# Patient Record
Sex: Female | Born: 1960 | Race: White | Hispanic: No | Marital: Married | Smoking: Former smoker
Health system: Southern US, Community
[De-identification: ages and names within clinical notes are randomized; demographics above are authoritative.]

## PROBLEM LIST (undated history)

## (undated) DIAGNOSIS — F410 Panic disorder [episodic paroxysmal anxiety] without agoraphobia: Secondary | ICD-10-CM

## (undated) DIAGNOSIS — E785 Hyperlipidemia, unspecified: Secondary | ICD-10-CM

## (undated) HISTORY — PX: BREAST ENHANCEMENT SURGERY: SHX7

## (undated) HISTORY — DX: Panic disorder (episodic paroxysmal anxiety): F41.0

## (undated) HISTORY — DX: Hyperlipidemia, unspecified: E78.5

## (undated) HISTORY — PX: MOUTH SURGERY: SHX715

---

## 1999-03-10 ENCOUNTER — Other Ambulatory Visit: Admission: RE | Admit: 1999-03-10 | Discharge: 1999-03-10 | Payer: Self-pay | Admitting: Obstetrics & Gynecology

## 2000-05-30 ENCOUNTER — Other Ambulatory Visit: Admission: RE | Admit: 2000-05-30 | Discharge: 2000-05-30 | Payer: Self-pay | Admitting: Obstetrics & Gynecology

## 2001-07-25 ENCOUNTER — Other Ambulatory Visit: Admission: RE | Admit: 2001-07-25 | Discharge: 2001-07-25 | Payer: Self-pay | Admitting: Neurology

## 2002-09-24 ENCOUNTER — Other Ambulatory Visit: Admission: RE | Admit: 2002-09-24 | Discharge: 2002-09-24 | Payer: Self-pay | Admitting: Obstetrics & Gynecology

## 2003-09-30 ENCOUNTER — Other Ambulatory Visit: Admission: RE | Admit: 2003-09-30 | Discharge: 2003-09-30 | Payer: Self-pay | Admitting: Obstetrics & Gynecology

## 2005-01-22 ENCOUNTER — Other Ambulatory Visit: Admission: RE | Admit: 2005-01-22 | Discharge: 2005-01-22 | Payer: Self-pay | Admitting: Obstetrics & Gynecology

## 2011-05-17 ENCOUNTER — Emergency Department (HOSPITAL_BASED_OUTPATIENT_CLINIC_OR_DEPARTMENT_OTHER)
Admission: EM | Admit: 2011-05-17 | Discharge: 2011-05-17 | Disposition: A | Discharge status: Home / Self Care | Payer: 59 | Attending: Emergency Medicine | Admitting: Emergency Medicine

## 2011-05-17 ENCOUNTER — Emergency Department (INDEPENDENT_AMBULATORY_CARE_PROVIDER_SITE_OTHER): Payer: 59

## 2011-05-17 DIAGNOSIS — R079 Chest pain, unspecified: Secondary | ICD-10-CM | POA: Insufficient documentation

## 2011-05-17 DIAGNOSIS — R0602 Shortness of breath: Secondary | ICD-10-CM | POA: Insufficient documentation

## 2011-05-17 LAB — DIFFERENTIAL
Eosinophils Relative: 6 % — ABNORMAL HIGH (ref 0–5)
Lymphocytes Relative: 39 % (ref 12–46)
Lymphs Abs: 2.9 10*3/uL (ref 0.7–4.0)
Monocytes Absolute: 0.5 10*3/uL (ref 0.1–1.0)
Monocytes Relative: 7 % (ref 3–12)
Neutro Abs: 3.6 10*3/uL (ref 1.7–7.7)

## 2011-05-17 LAB — BASIC METABOLIC PANEL
CO2: 27 mEq/L (ref 19–32)
Calcium: 10.1 mg/dL (ref 8.4–10.5)
GFR calc Af Amer: >60 mL/min (ref >60)
GFR calc non Af Amer: >60 mL/min (ref >60)
Glucose, Bld: 103 mg/dL — ABNORMAL HIGH (ref 70–99)
Potassium: 4.2 mEq/L (ref 3.5–5.1)
Sodium: 142 mEq/L (ref 135–145)

## 2011-05-17 LAB — CBC
HCT: 37.4 % (ref 36.0–46.0)
Hemoglobin: 12.7 g/dL (ref 12.0–15.0)
MCHC: 34 g/dL (ref 30.0–36.0)
MCV: 91 fL (ref 78.0–100.0)
RDW: 11.9 % (ref 11.5–15.5)

## 2012-08-26 IMAGING — CR DG CHEST 2V
2 series · 2 of 2 positions shown · non-contrast
Comparison: None

CLINICAL DATA: Chest pain.  Short of breath.  Chest tightness.

CHEST - 2 VIEW

[w chest pa]
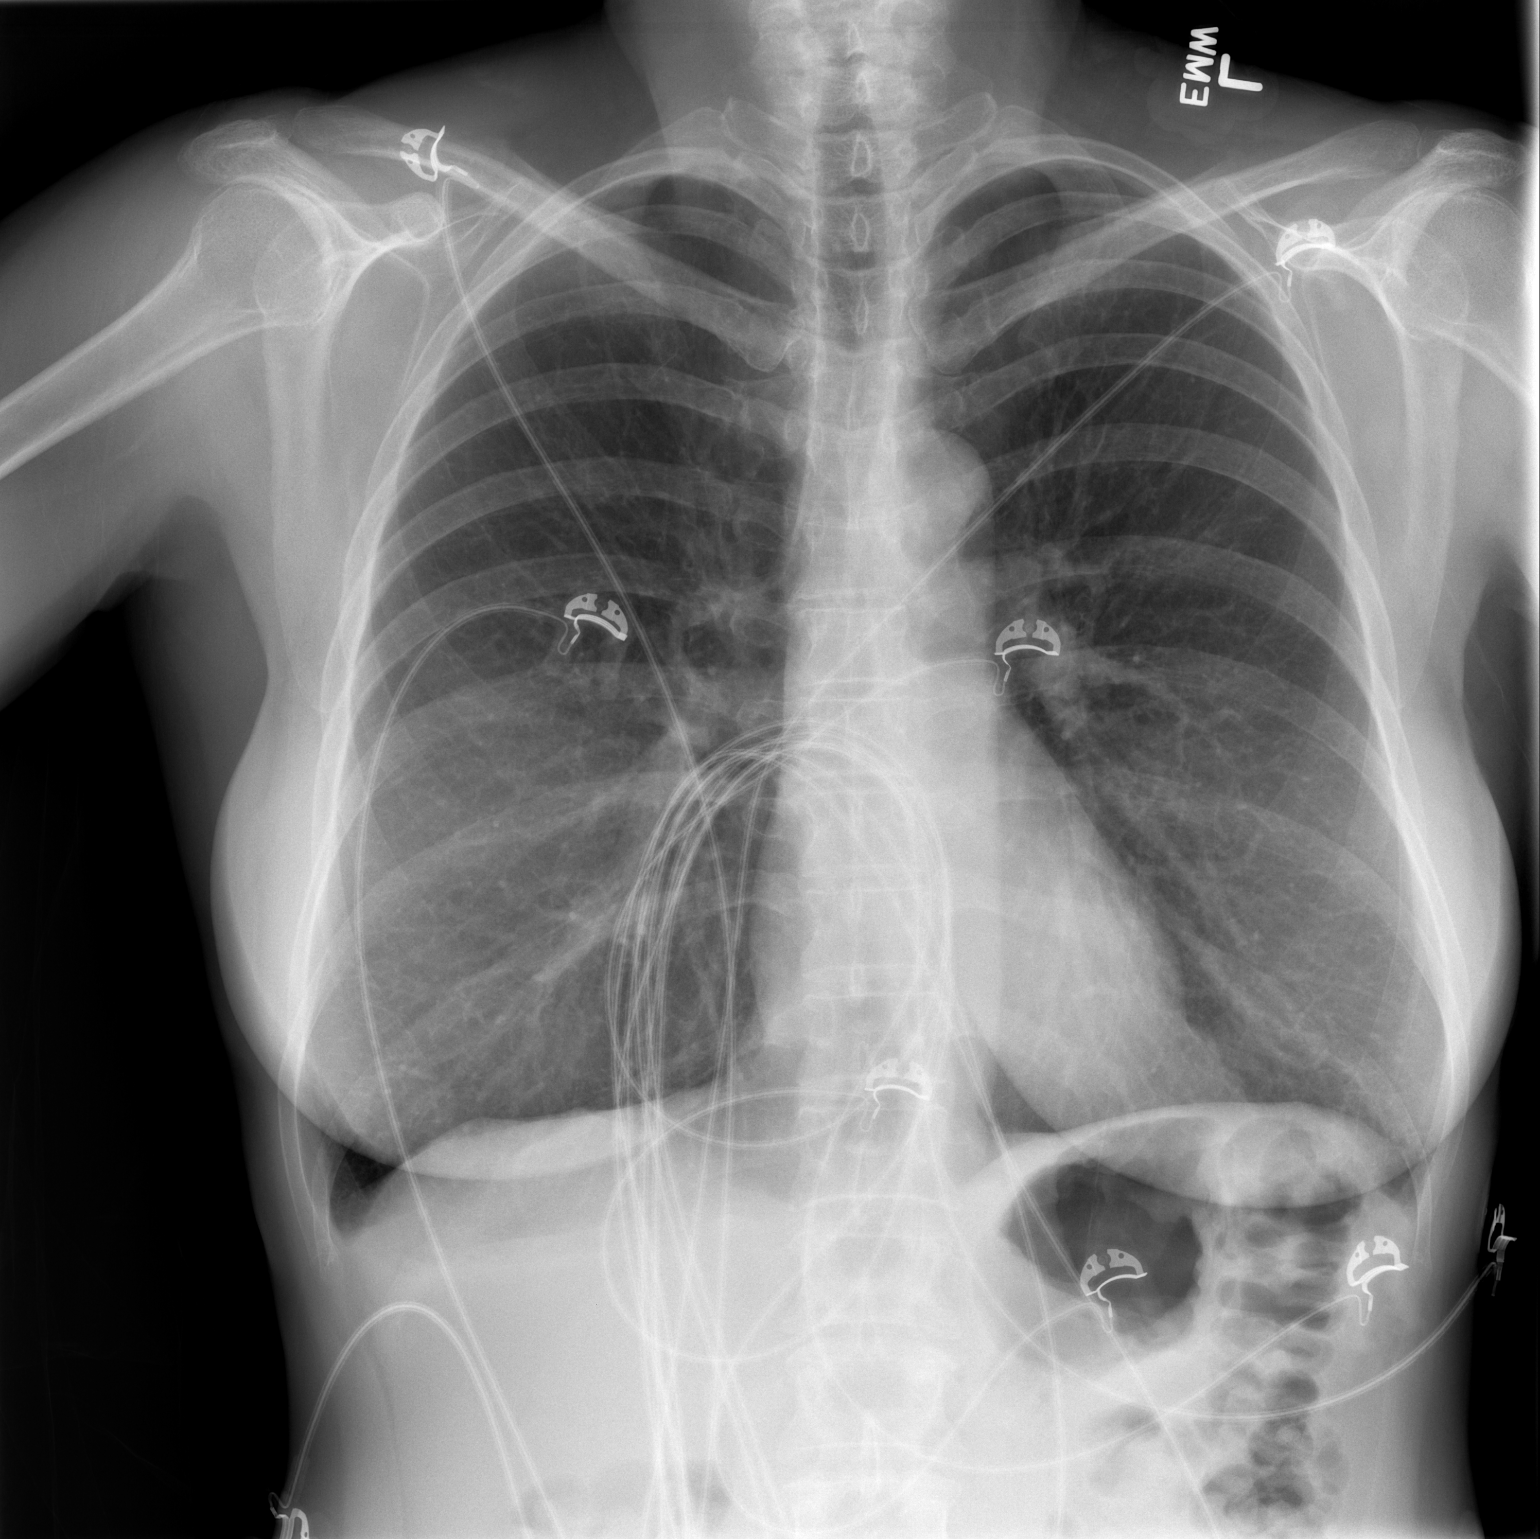

[w chest lat]
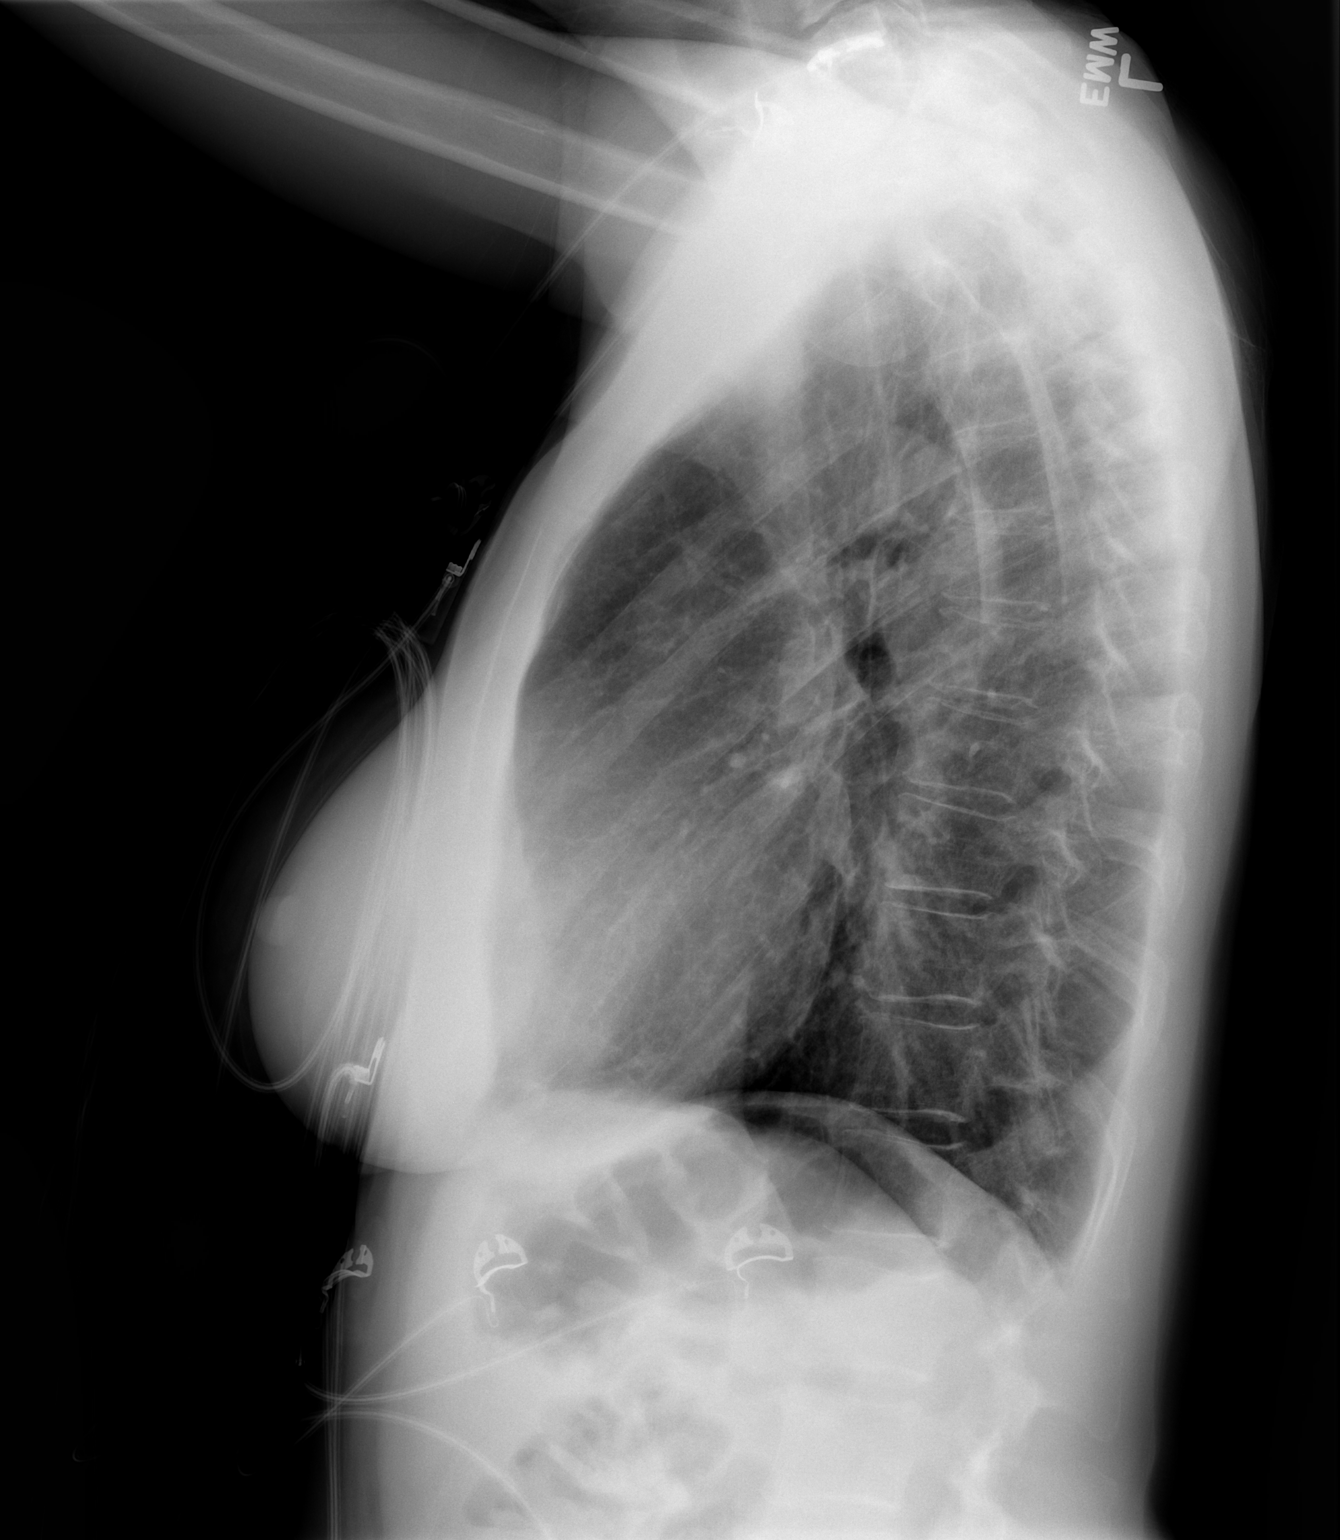

[2 of 2 positions shown; findings below may reference images not displayed]

FINDINGS: Extensive artifact overlies chest.  Heart size is normal.
Mediastinal shadows are normal.  Lungs are clear.  No effusions.
No bony abnormalities.
IMPRESSION: No active disease

## 2013-10-29 ENCOUNTER — Other Ambulatory Visit: Payer: Self-pay | Admitting: Family Medicine

## 2013-10-29 ENCOUNTER — Ambulatory Visit
Admission: RE | Admit: 2013-10-29 | Discharge: 2013-10-29 | Disposition: A | Discharge status: Home / Self Care | Payer: BC Managed Care – PPO | Source: Ambulatory Visit | Attending: Family Medicine | Admitting: Family Medicine

## 2013-10-29 DIAGNOSIS — R05 Cough: Secondary | ICD-10-CM

## 2014-02-24 ENCOUNTER — Encounter: Payer: Self-pay | Admitting: *Deleted

## 2015-02-08 IMAGING — CR DG CHEST 2V
2 series · 2 of 2 positions shown · non-contrast
Comparison: 05/17/2011

CLINICAL DATA: Cough for several weeks

EXAM:
CHEST  2 VIEW

[view not recorded (1 of 2)]
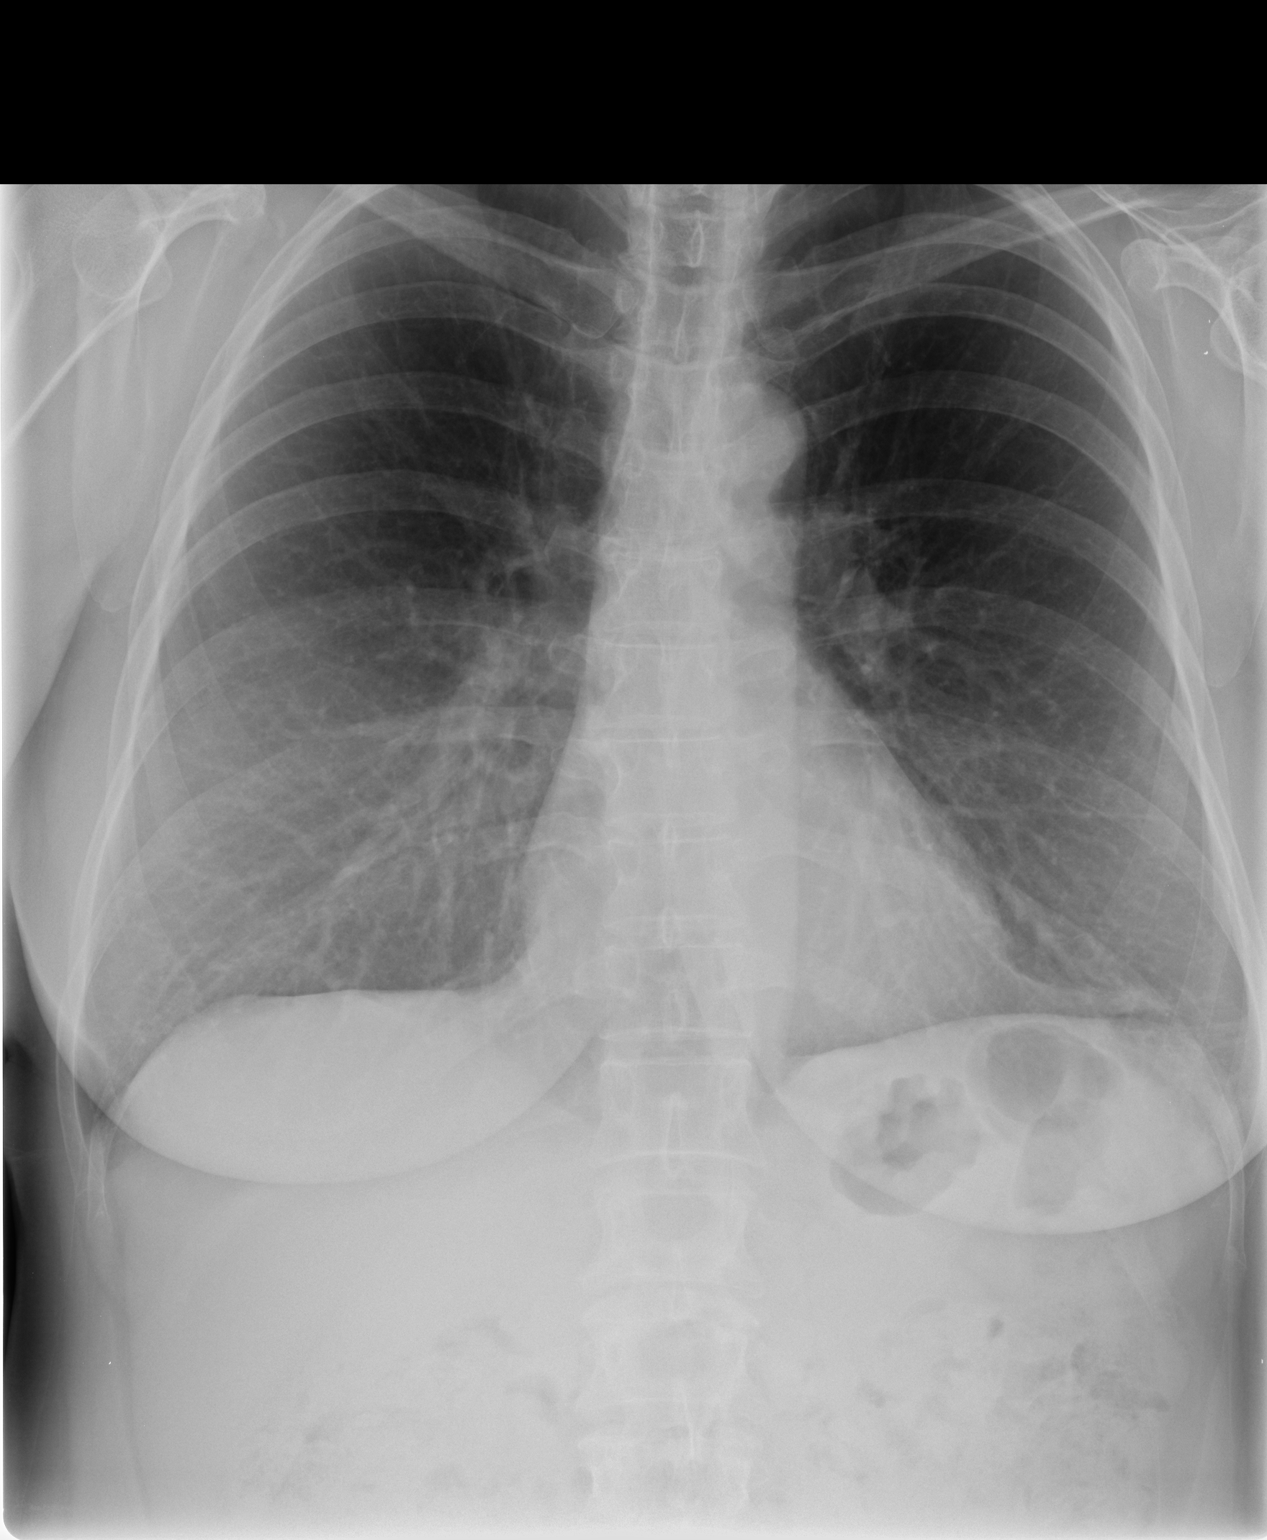

[view not recorded (2 of 2)]
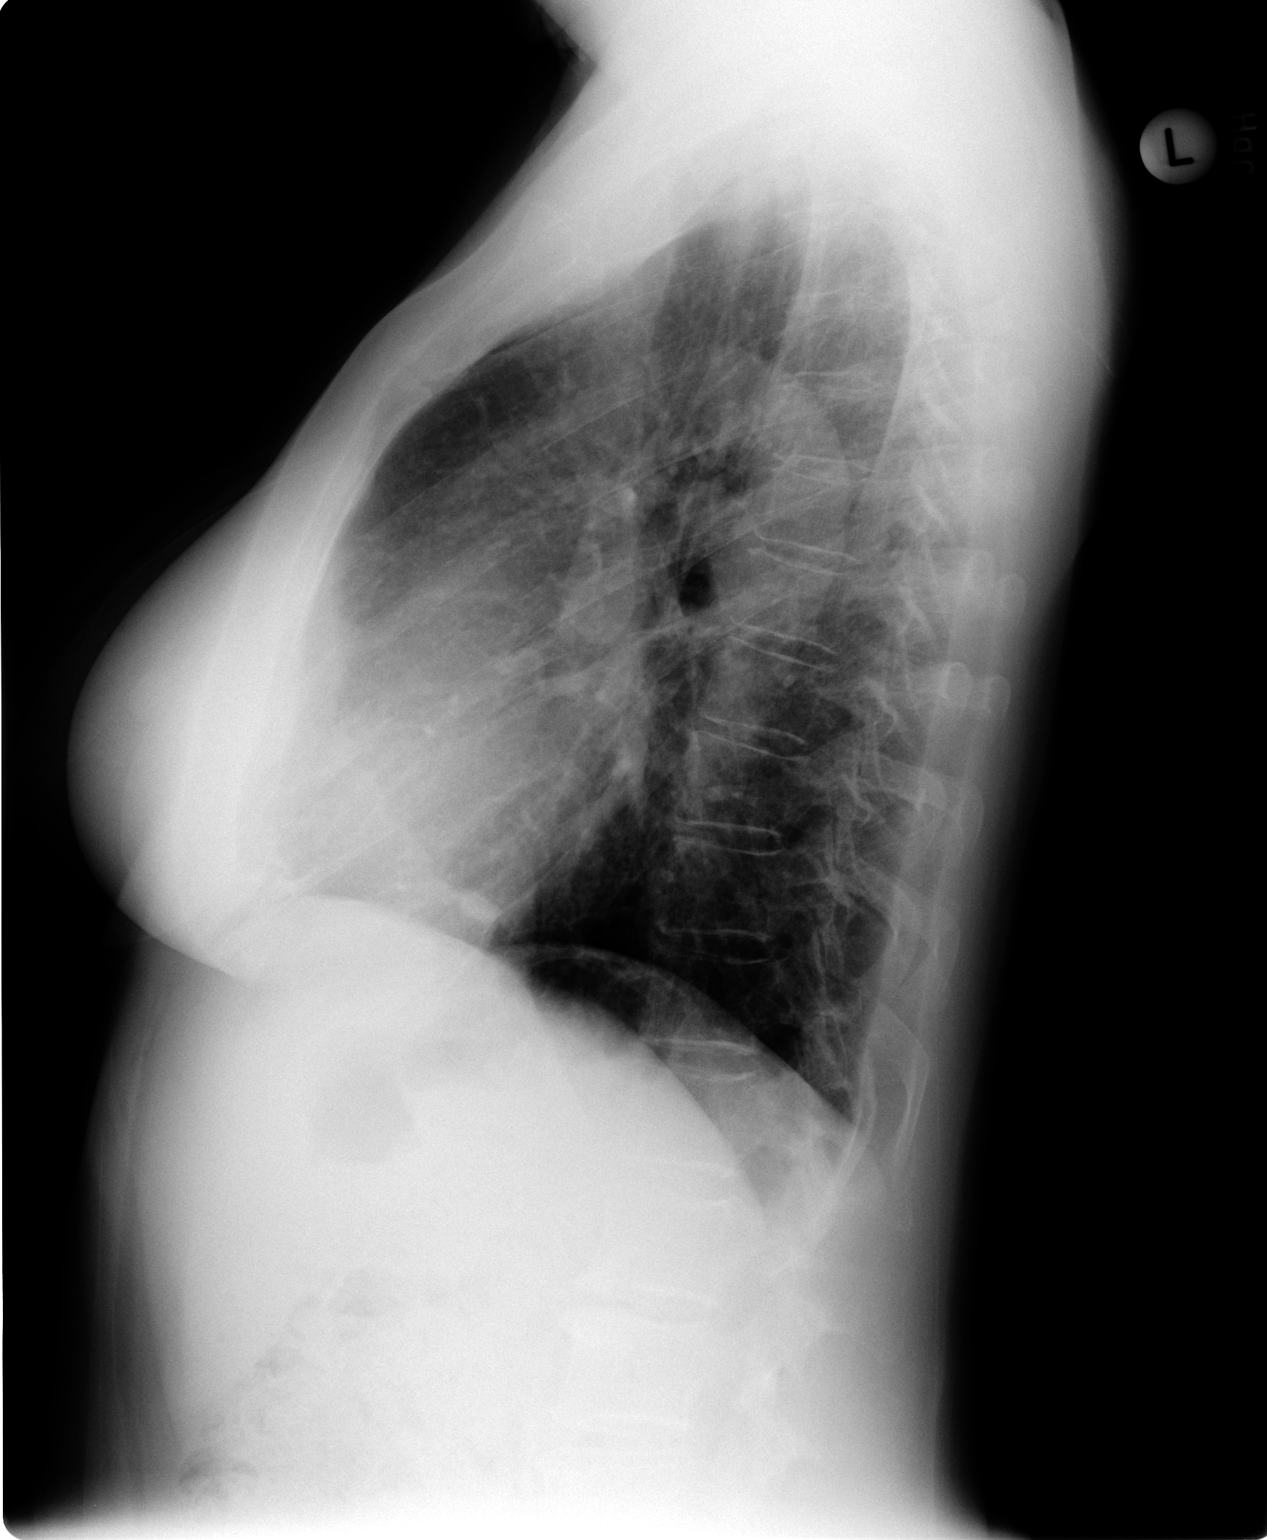

[2 of 2 positions shown; findings below may reference images not displayed]

FINDINGS: The heart size and mediastinal contours are within normal limits.
Both lungs are clear. The visualized skeletal structures are
unremarkable.
IMPRESSION: No active cardiopulmonary disease.

## 2023-01-13 ENCOUNTER — Other Ambulatory Visit: Payer: Self-pay | Admitting: *Deleted

## 2023-01-13 DIAGNOSIS — Z87891 Personal history of nicotine dependence: Secondary | ICD-10-CM

## 2023-01-13 DIAGNOSIS — Z122 Encounter for screening for malignant neoplasm of respiratory organs: Secondary | ICD-10-CM

## 2023-03-02 ENCOUNTER — Encounter: Payer: Self-pay | Admitting: Acute Care

## 2023-03-02 ENCOUNTER — Ambulatory Visit (INDEPENDENT_AMBULATORY_CARE_PROVIDER_SITE_OTHER): Payer: Commercial Managed Care - HMO | Admitting: Acute Care

## 2023-03-02 DIAGNOSIS — Z87891 Personal history of nicotine dependence: Secondary | ICD-10-CM | POA: Diagnosis not present

## 2023-03-02 NOTE — Patient Instructions (Signed)
Thank you for participating in the [facility] Scre[facility]. It was our pleasure to meet you today. We will call you with the results of your scan within the next few days. Your scan will be assigned a Lung RADS category score by the physicians reading the scans.  This Lung RADS score determines follow up scanning.  See below for description of categories, and follow up screening recommendations. We will be in touch to schedule your follow up screening annually or based on recommendations of our providers. We will fax a copy of your scan results to your Primary Care Physician, or the physician who referred you to the program, to ensure they have the results. Please call the office if you have any questions or concerns regarding your scanning experience or results.  Our office number is [phone]. Please speak with Denise Phelps, RN. , or  Denise Buckner RN, They are  our Lung Cancer Screening RN.'s If They are unavailable when you call, Please leave a message on the voice mail. We will return your call at our earliest convenience.This voice mail is monitored several times a day.  Remember, if your scan is normal, we will scan you annually as long as you continue to meet the criteria for the program. (Age 50-80, Current smoker or smoker who has quit within the last 15 years). If you are a smoker, remember, quitting is the single most powerful action that you can take to decrease your risk of lung cancer and other pulmonary, breathing related problems. We know quitting is hard, and we are here to help.  Please let us know if there is anything we can do to help you meet your goal of quitting. If you are a former smoker, congratulations. We are proud of you! Remain smoke free! Remember you can refer friends or family members through the number above.  We will screen them to make sure they meet criteria for the program. Thank you for helping us take better care of you by  participating in Lung Screening.  You can receive free nicotine replacement therapy ( patches, gum or mints) by calling 1-800-QUIT NOW. Please call so we can get you on the path to becoming  a non-smoker. I know it is hard, but you can do this!  Lung RADS Categories:  Lung RADS 1: no nodules or definitely non-concerning nodules.  Recommendation is for a repeat annual scan in 12 months.  Lung RADS 2:  nodules that are non-concerning in appearance and behavior with a very low likelihood of becoming an active cancer. Recommendation is for a repeat annual scan in 12 months.  Lung RADS 3: nodules that are probably non-concerning , includes nodules with a low likelihood of becoming an active cancer.  Recommendation is for a 6-month repeat screening scan. Often noted after an upper respiratory illness. We will be in touch to make sure you have no questions, and to schedule your 6-month scan.  Lung RADS 4 A: nodules with concerning findings, recommendation is most often for a follow up scan in 3 months or additional testing based on our provider's assessment of the scan. We will be in touch to make sure you have no questions and to schedule the recommended 3 month follow up scan.  Lung RADS 4 B:  indicates findings that are concerning. We will be in touch with you to schedule additional diagnostic testing based on our provider's  assessment of the scan.  Other options for assistance in smoking cessation (   As covered by your insurance benefits)  Hypnosis for smoking cessation  [employer] [phone]  Acupuncture for smoking cessation  [facility] [phone]   

## 2023-03-02 NOTE — Progress Notes (Signed)
Virtual Visit via Telephone Note  I connected with Carolyn Barnett on 03/02/23 at  8:30 AM EDT by telephone and verified that I am speaking with the correct person using two identifiers.  Location: Patient:  At home Provider:  [address], [place], [place], [address]    I discussed the limitations, risks, security and privacy concerns of performing an evaluation and management service by telephone and the availability of in person appointments. I also discussed with the patient that there may be a patient responsible charge related to this service. The patient expressed understanding and agreed to proceed.   Shared Decision Making Visit Lung Cancer Screening Program [phone])   Eligibility: Age 62 y.o. Pack Years Smoking History Calculation 29 pack year smoking history (# packs/per year x # years smoked) Recent History of coughing up blood  no Unexplained weight loss? no ( >Than 15 pounds within the last 6 months ) Prior History Lung / other cancer no (Diagnosis within the last 5 years already requiring surveillance chest CT Scans). Smoking Status Former Smoker Former Smokers: Years since quit: 7 years  Quit Date: 2017  Visit Components: Discussion included one or more decision making aids. yes Discussion included risk/benefits of screening. yes Discussion included potential follow up diagnostic testing for abnormal scans. yes Discussion included meaning and risk of over diagnosis. yes Discussion included meaning and risk of False Positives. yes Discussion included meaning of total radiation exposure. yes  Counseling Included: Importance of adherence to annual lung cancer LDCT screening. yes Impact of comorbidities on ability to participate in the program. yes Ability and willingness to under diagnostic treatment. yes  Smoking Cessation Counseling: Current Smokers:  Discussed importance of smoking cessation. yes Information about tobacco cessation classes and  interventions provided to patient. yes Patient provided with "ticket" for LDCT Scan. yes Symptomatic Patient. no  Counseling NA Diagnosis Code: Tobacco Use Z72.0 Asymptomatic Patient yes  Counseling (Intermediate counseling: > three minutes counseling) [id number] Former Smokers:  Discussed the importance of maintaining cigarette abstinence. yes Diagnosis Code: Personal History of Nicotine Dependence. [id number] Information about tobacco cessation classes and interventions provided to patient. Yes Patient provided with "ticket" for LDCT Scan. yes Written Order for Lung Cancer Screening with LDCT placed in Epic. Yes (CT Chest Lung Cancer Screening Low Dose W/O CM) [id number] Z12.2-Screening of respiratory organs Z87.891-Personal history of nicotine dependence  I spent 25 minutes of face to face time/virtual visit time  with  Ms. Kempinski discussing the risks and benefits of lung cancer screening. We took the time to pause the power point at intervals to allow for questions to be asked and answered to ensure understanding. We discussed that she had taken the single most powerful action possible to decrease her risk of developing lung cancer when she quit smoking. I counseled her to remain smoke free, and to contact me if she ever had the desire to smoke again so that I can provide resources and tools to help support the effort to remain smoke free. We discussed the time and location of the scan, and that either  Doroteo Glassman RN, Joella Prince, RN or I  or I will call / send a letter with the results within  24-72 hours of receiving them. She has the office contact information in the event she needs to speak with me,  She verbalized understanding of all of the above and had no further questions upon leaving the office.     I explained to the patient that there  has been a high incidence of coronary artery disease noted on these exams. I explained that this is a non-gated exam therefore degree or severity cannot  be determined. This patient is not  on statin therapy. I have asked the patient to follow-up with their PCP regarding any incidental finding of coronary artery disease and management with diet or medication as they feel is clinically indicated. The patient verbalized understanding of the above and had no further questions.     Magdalen Spatz, NP

## 2023-03-03 ENCOUNTER — Ambulatory Visit
Admission: RE | Admit: 2023-03-03 | Discharge: 2023-03-03 | Disposition: A | Discharge status: Home / Self Care | Payer: Commercial Managed Care - HMO | Source: Ambulatory Visit | Attending: Acute Care | Admitting: Acute Care

## 2023-03-03 DIAGNOSIS — Z122 Encounter for screening for malignant neoplasm of respiratory organs: Secondary | ICD-10-CM

## 2023-03-03 DIAGNOSIS — Z87891 Personal history of nicotine dependence: Secondary | ICD-10-CM

## 2023-03-04 ENCOUNTER — Other Ambulatory Visit: Payer: Self-pay | Admitting: Acute Care

## 2023-03-04 DIAGNOSIS — Z87891 Personal history of nicotine dependence: Secondary | ICD-10-CM

## 2023-03-04 DIAGNOSIS — Z122 Encounter for screening for malignant neoplasm of respiratory organs: Secondary | ICD-10-CM

## 2023-04-04 ENCOUNTER — Other Ambulatory Visit (HOSPITAL_COMMUNITY): Payer: Self-pay | Admitting: Family Medicine

## 2023-04-04 DIAGNOSIS — E7800 Pure hypercholesterolemia, unspecified: Secondary | ICD-10-CM

## 2023-04-12 ENCOUNTER — Ambulatory Visit (HOSPITAL_COMMUNITY)
Admission: RE | Admit: 2023-04-12 | Discharge: 2023-04-12 | Disposition: A | Discharge status: Home / Self Care | Payer: Commercial Managed Care - HMO | Source: Ambulatory Visit | Attending: Family Medicine | Admitting: Family Medicine

## 2023-04-12 DIAGNOSIS — E7800 Pure hypercholesterolemia, unspecified: Secondary | ICD-10-CM | POA: Insufficient documentation

## 2023-06-10 NOTE — Progress Notes (Signed)
Cardiology Office Note:   Date:  06/20/2023  NAME:  Carolyn Barnett    MRN: [id number] DOB:  Oct 16, 1961   PCP:  Darrow Bussing, MD  Cardiologist:  Reatha Harps, MD  Electrophysiologist:  None   Referring MD: Darrow Bussing, MD   Chief Complaint  Patient presents with   Coronary Artery Disease    History of Present Illness:   Carolyn Barnett is a 62 y.o. female with a hx of hyperlipidemia who is being seen today for the evaluation of elevated coronary calcium score at the request of Koirala, Dibas, MD. recently underwent calcium scoring.  Value 128 which is 90th percentile.  Denies any chest pain or trouble breathing.  No regular exercise.  She is working on her diet.  Most recent LDL cholesterol 134.  She is a former smoker of nearly 36 years.  No strong family history of heart disease.  She has never had a heart attack or stroke.  She is not diabetic.  She does report a history of anxiety but this is controlled.  She is not wanting to take cholesterol medication.  She is really worked on her diet.  She would like to recheck lipids before starting any medication.  BP is stable.  CV exam is normal.  EKG is normal.  She is married.  She has 3 children.  She has 5 grandchildren.  She has another grandchild on the way.  No complaints today.  We did discuss exercise and proper diet.  Problem List Coronary calcium -CAC 128 (90th percentile)  2. HLD -T chol 219, HDL 68, LDL 134, TG  99 3. Former smoker   Past Medical History: Past Medical History:  Diagnosis Date   Hyperlipidemia    Panic attacks     Past Surgical History: Past Surgical History:  Procedure Laterality Date   BREAST ENHANCEMENT SURGERY     MOUTH SURGERY      Current Medications: Current Meds  Medication Sig   ALPRAZolam (XANAX) 0.25 MG tablet Take 0.25 mg by mouth at bedtime as needed for anxiety.     Allergies:    Patient has no known allergies.   Social History: Social History   Socioeconomic History    Marital status: Married    Spouse name: Not on file   Number of children: 3   Years of education: Not on file   Highest education level: Not on file  Occupational History   Not on file  Tobacco Use   Smoking status: Former    Packs/day: 0.75    Years: 38.00    Additional pack years: 0.00    Total pack years: 28.50    Types: Cigarettes    Quit date: 2017    Years since quitting: 7.5   Smokeless tobacco: Not on file  Substance and Sexual Activity   Alcohol use: Yes   Drug use: No   Sexual activity: Not on file  Other Topics Concern   Not on file  Social History Narrative   ** Merged History Encounter **       Social Determinants of Health   Financial Resource Strain: Not on file  Food Insecurity: Not on file  Transportation Needs: Not on file  Physical Activity: Not on file  Stress: Not on file  Social Connections: Not on file     Family History: The patient's family history includes Hypertension in her mother and sister.  ROS:   All other ROS reviewed and negative. Pertinent positives noted  in the HPI.     EKGs/Labs/Other Studies Reviewed:   The following studies were personally reviewed by me today:  EKG:  EKG is ordered today.    EKG Interpretation Date/Time:  Monday June 20 2023 08:20:16 EDT Ventricular Rate:  69 PR Interval:  128 QRS Duration:  94 QT Interval:  404 QTC Calculation: 432 R Axis:   66  Text Interpretation: Normal sinus rhythm Incomplete right bundle branch block Confirmed by Lennie Odor [phone]) on 06/20/2023 8:23:24 AM   Recent Labs: No results found for requested labs within last 365 days.   Recent Lipid Panel No results found for: "CHOL", "TRIG", "HDL", "CHOLHDL", "VLDL", "LDLCALC", "LDLDIRECT"  Physical Exam:   VS:  BP 122/82   Pulse 69   Ht 5\' 3"  (1.6 m)   Wt 149 lb (67.6 kg)   SpO2 98%   BMI 26.39 kg/m    Wt Readings from Last 3 Encounters:  06/20/23 149 lb (67.6 kg)    General: Well nourished, well developed, in no  acute distress Head: Atraumatic, normal size  Eyes: PEERLA, EOMI  Neck: Supple, no JVD Endocrine: No thryomegaly Cardiac: Normal S1, S2; RRR; no murmurs, rubs, or gallops Lungs: Clear to auscultation bilaterally, no wheezing, rhonchi or rales  Abd: Soft, nontender, no hepatomegaly  Ext: No edema, pulses 2+ Musculoskeletal: No deformities, BUE and BLE strength normal and equal Skin: Warm and dry, no rashes   Neuro: Alert and oriented to person, place, time, and situation, CNII-XII grossly intact, no focal deficits  Psych: Normal mood and affect   ASSESSMENT:   Carolyn Barnett is a 62 y.o. female who presents for the following: 1. Agatston coronary artery calcium score between 100 and 400   2. Mixed hyperlipidemia     PLAN:   1. Agatston coronary artery calcium score between 100 and 400 2. Mixed hyperlipidemia Coronary calcium score 128 which is 90th percentile.  LDL cholesterol 134.  We did discuss that her score is accelerated for age.  Would recommend statin medication.  She would like to hold on this and recheck cholesterol levels before initiation of medication.  We did discuss that aspirin is likely not needed at this point.  I believe calcium score is above 300 or more beneficial for aspirin.  We discussed proper diet and exercise.  She seems to be working on her diet.  She will start exercising.  We discussed 150 minutes/week of moderate to vigorous physical activity.  She reports no symptoms of angina.  No need for a stress test.  We will plan to recheck her lipids and then have a plan on statin medication at that time.  She is hopeful to avoid medications if possible.  We did discuss I do not believe this is likely.  We will check her labs and have her follow-up based on the results of that test.      Disposition: Return if symptoms worsen or fail to improve.  Medication Adjustments/Labs and Tests Ordered: Current medicines are reviewed at length with the patient today.  Concerns  regarding medicines are outlined above.  Orders Placed This Encounter  Procedures   Lipid panel   EKG 12-Lead   No orders of the defined types were placed in this encounter.  Patient Instructions  Medication Instructions:   No changes   -  *If you need a refill on your cardiac medications before your next appointment, please call your pharmacy*   Lab Work: LIPID- depending  on  results will  discuss  next step of medication    If you have labs (blood work) drawn today and your tests are completely normal, you will receive your results only by: MyChart Message (if you have MyChart) OR A paper copy in the mail If you have any lab test that is abnormal or we need to change your treatment, we will call you to review the results.   Testing/Procedures: Not needed   Follow-Up: At [facility], you and your health needs are our priority.  As part of our continuing mission to provide you with exceptional heart care, we have created designated Provider Care Teams.  These Care Teams include your primary Cardiologist (physician) and Advanced Practice Providers (APPs -  Physician Assistants and Nurse Practitioners) who all work together to provide you with the care you need, when you need it.     Your next appointment:     As needed  The format for your next appointment:   In Person  Provider:   Reatha Harps, MD    Other Instructions   Your physician discussed the importance of regular exercise and recommended that you start or continue a regular exercise program for good health.     Signed, Lenna Gilford. Flora Lipps, MD, [facility]  [facility]  [address] [place], [place] [address] [phone]  06/20/2023 9:03 AM

## 2023-06-20 ENCOUNTER — Encounter: Payer: Self-pay | Admitting: Cardiovascular Disease

## 2023-06-20 ENCOUNTER — Ambulatory Visit: Payer: Commercial Managed Care - HMO | Attending: Cardiovascular Disease | Admitting: Cardiovascular Disease

## 2023-06-20 VITALS — BP 122/82 | HR 69 | Ht 63.0 in | Wt 149.0 lb

## 2023-06-20 DIAGNOSIS — R931 Abnormal findings on diagnostic imaging of heart and coronary circulation: Secondary | ICD-10-CM

## 2023-06-20 DIAGNOSIS — E782 Mixed hyperlipidemia: Secondary | ICD-10-CM | POA: Diagnosis not present

## 2023-06-20 NOTE — Patient Instructions (Addendum)
Medication Instructions:   No changes   -  *If you need a refill on your cardiac medications before your next appointment, please call your pharmacy*   Lab Work: LIPID- depending  on  results will  discuss  next step of medication    If you have labs (blood work) drawn today and your tests are completely normal, you will receive your results only by: MyChart Message (if you have MyChart) OR A paper copy in the mail If you have any lab test that is abnormal or we need to change your treatment, we will call you to review the results.   Testing/Procedures: Not needed   Follow-Up: At [facility], you and your health needs are our priority.  As part of our continuing mission to provide you with exceptional heart care, we have created designated Provider Care Teams.  These Care Teams include your primary Cardiologist (physician) and Advanced Practice Providers (APPs -  Physician Assistants and Nurse Practitioners) who all work together to provide you with the care you need, when you need it.     Your next appointment:     As needed  The format for your next appointment:   In Person  Provider:   Reatha Harps, MD    Other Instructions   Your physician discussed the importance of regular exercise and recommended that you start or continue a regular exercise program for good health.

## 2024-02-02 ENCOUNTER — Other Ambulatory Visit: Payer: Self-pay | Admitting: *Deleted

## 2024-02-02 DIAGNOSIS — Z87891 Personal history of nicotine dependence: Secondary | ICD-10-CM

## 2024-02-02 DIAGNOSIS — Z122 Encounter for screening for malignant neoplasm of respiratory organs: Secondary | ICD-10-CM

## 2024-02-03 ENCOUNTER — Encounter: Payer: Self-pay | Admitting: Acute Care

## 2024-03-08 ENCOUNTER — Ambulatory Visit
Admission: RE | Admit: 2024-03-08 | Discharge: 2024-03-08 | Disposition: A | Discharge status: Home / Self Care | Payer: Commercial Managed Care - HMO | Source: Ambulatory Visit | Attending: Acute Care | Admitting: Acute Care

## 2024-03-08 DIAGNOSIS — Z87891 Personal history of nicotine dependence: Secondary | ICD-10-CM

## 2024-03-08 DIAGNOSIS — Z122 Encounter for screening for malignant neoplasm of respiratory organs: Secondary | ICD-10-CM

## 2024-04-12 ENCOUNTER — Other Ambulatory Visit: Payer: Self-pay

## 2024-04-12 DIAGNOSIS — Z122 Encounter for screening for malignant neoplasm of respiratory organs: Secondary | ICD-10-CM

## 2024-04-12 DIAGNOSIS — Z87891 Personal history of nicotine dependence: Secondary | ICD-10-CM

## 2025-03-11 ENCOUNTER — Other Ambulatory Visit
